# Patient Record
Sex: Female | Born: 1995 | Race: White | Hispanic: No | Marital: Single | Smoking: Current some day smoker
Health system: Southern US, Community
[De-identification: ages and names within clinical notes are randomized; demographics above are authoritative.]

## PROBLEM LIST (undated history)

## (undated) DIAGNOSIS — F419 Anxiety disorder, unspecified: Secondary | ICD-10-CM

## (undated) DIAGNOSIS — F32A Depression, unspecified: Secondary | ICD-10-CM

## (undated) DIAGNOSIS — F329 Major depressive disorder, single episode, unspecified: Secondary | ICD-10-CM

## (undated) DIAGNOSIS — I471 Supraventricular tachycardia, unspecified: Secondary | ICD-10-CM

---

## 2014-09-19 DIAGNOSIS — S060X0A Concussion without loss of consciousness, initial encounter: Secondary | ICD-10-CM | POA: Insufficient documentation

## 2014-09-19 DIAGNOSIS — Y9389 Activity, other specified: Secondary | ICD-10-CM | POA: Insufficient documentation

## 2014-09-19 DIAGNOSIS — S0990XA Unspecified injury of head, initial encounter: Secondary | ICD-10-CM | POA: Diagnosis present

## 2014-09-19 DIAGNOSIS — W228XXA Striking against or struck by other objects, initial encounter: Secondary | ICD-10-CM | POA: Diagnosis not present

## 2014-09-19 DIAGNOSIS — Z79899 Other long term (current) drug therapy: Secondary | ICD-10-CM | POA: Insufficient documentation

## 2014-09-19 DIAGNOSIS — Y998 Other external cause status: Secondary | ICD-10-CM | POA: Diagnosis not present

## 2014-09-19 DIAGNOSIS — Y9289 Other specified places as the place of occurrence of the external cause: Secondary | ICD-10-CM | POA: Diagnosis not present

## 2014-09-19 DIAGNOSIS — Z3202 Encounter for pregnancy test, result negative: Secondary | ICD-10-CM | POA: Diagnosis not present

## 2014-09-20 ENCOUNTER — Encounter (HOSPITAL_COMMUNITY): Payer: Self-pay | Admitting: Emergency Medicine

## 2014-09-20 ENCOUNTER — Emergency Department (HOSPITAL_COMMUNITY): Payer: BLUE CROSS/BLUE SHIELD

## 2014-09-20 ENCOUNTER — Emergency Department (HOSPITAL_COMMUNITY)
Admission: EM | Admit: 2014-09-20 | Discharge: 2014-09-20 | Disposition: A | Discharge status: Home / Self Care | Payer: BLUE CROSS/BLUE SHIELD | Attending: Emergency Medicine | Admitting: Emergency Medicine

## 2014-09-20 DIAGNOSIS — S0990XA Unspecified injury of head, initial encounter: Secondary | ICD-10-CM

## 2014-09-20 DIAGNOSIS — S060X0A Concussion without loss of consciousness, initial encounter: Secondary | ICD-10-CM

## 2014-09-20 LAB — URINALYSIS, ROUTINE W REFLEX MICROSCOPIC
Bilirubin Urine: NEGATIVE
GLUCOSE, UA: NEGATIVE mg/dL
Hgb urine dipstick: NEGATIVE
Ketones, ur: NEGATIVE mg/dL
Leukocytes, UA: NEGATIVE
Nitrite: NEGATIVE
PH: 6.5 (ref 5.0–8.0)
Protein, ur: NEGATIVE mg/dL
Specific Gravity, Urine: 1.019 (ref 1.005–1.030)
Urobilinogen, UA: 0.2 mg/dL (ref 0.0–1.0)

## 2014-09-20 LAB — COMPREHENSIVE METABOLIC PANEL
ALT: 12 U/L (ref 0–35)
AST: 16 U/L (ref 0–37)
Albumin: 3.6 g/dL (ref 3.5–5.2)
Alkaline Phosphatase: 47 U/L (ref 39–117)
Anion gap: 8 (ref 5–15)
BUN: 10 mg/dL (ref 6–23)
CO2: 23 mmol/L (ref 19–32)
Calcium: 8.7 mg/dL (ref 8.4–10.5)
Chloride: 106 mEq/L (ref 96–112)
Creatinine, Ser: 0.69 mg/dL (ref 0.50–1.10)
GFR calc Af Amer: >90 mL/min (ref >90)
GFR calc non Af Amer: >90 mL/min (ref >90)
Glucose, Bld: 92 mg/dL (ref 70–99)
Potassium: 3.6 mmol/L (ref 3.5–5.1)
Sodium: 137 mmol/L (ref 135–145)
Total Bilirubin: 0.4 mg/dL (ref 0.3–1.2)
Total Protein: 6.6 g/dL (ref 6.0–8.3)

## 2014-09-20 LAB — CBC WITH DIFFERENTIAL/PLATELET
Basophils Absolute: 0 10*3/uL (ref 0.0–0.1)
Basophils Relative: 1 % (ref 0–1)
EOS ABS: 0 10*3/uL (ref 0.0–0.7)
EOS PCT: 1 % (ref 0–5)
HCT: 34.5 % — ABNORMAL LOW (ref 36.0–46.0)
Hemoglobin: 11.5 g/dL — ABNORMAL LOW (ref 12.0–15.0)
Lymphocytes Relative: 35 % (ref 12–46)
Lymphs Abs: 2.6 10*3/uL (ref 0.7–4.0)
MCH: 29.8 pg (ref 26.0–34.0)
MCHC: 33.3 g/dL (ref 30.0–36.0)
MCV: 89.4 fL (ref 78.0–100.0)
MONO ABS: 0.5 10*3/uL (ref 0.1–1.0)
Monocytes Relative: 6 % (ref 3–12)
Neutro Abs: 4.4 10*3/uL (ref 1.7–7.7)
Neutrophils Relative %: 57 % (ref 43–77)
Platelets: 248 10*3/uL (ref 150–400)
RBC: 3.86 MIL/uL — ABNORMAL LOW (ref 3.87–5.11)
RDW: 12.7 % (ref 11.5–15.5)
WBC: 7.5 10*3/uL (ref 4.0–10.5)

## 2014-09-20 LAB — RAPID URINE DRUG SCREEN, HOSP PERFORMED
Amphetamines: NOT DETECTED
BARBITURATES: NOT DETECTED
BENZODIAZEPINES: NOT DETECTED
Cocaine: NOT DETECTED
OPIATES: NOT DETECTED
Tetrahydrocannabinol: NOT DETECTED

## 2014-09-20 LAB — PREGNANCY, URINE: PREG TEST UR: NEGATIVE

## 2014-09-20 LAB — ETHANOL

## 2014-09-20 NOTE — Discharge Instructions (Signed)
Concussion  A concussion, or closed-head injury, is a brain injury caused by a direct blow to the head or by a quick and sudden movement (jolt) of the head or neck. Concussions are usually not life-threatening. Even so, the effects of a concussion can be serious. If you have had a concussion before, you are more likely to experience concussion-like symptoms after a direct blow to the head.   CAUSES  · Direct blow to the head, such as from running into another player during a soccer game, being hit in a fight, or hitting your head on a hard surface.  · A jolt of the head or neck that causes the brain to move back and forth inside the skull, such as in a car crash.  SIGNS AND SYMPTOMS  The signs of a concussion can be hard to notice. Early on, they may be missed by you, family members, and health care providers. You may look fine but act or feel differently.  Symptoms are usually temporary, but they may last for days, weeks, or even longer. Some symptoms may appear right away while others may not show up for hours or days. Every head injury is different. Symptoms include:  · Mild to moderate headaches that will not go away.  · A feeling of pressure inside your head.  · Having more trouble than usual:  ¨ Learning or remembering things you have heard.  ¨ Answering questions.  ¨ Paying attention or concentrating.  ¨ Organizing daily tasks.  ¨ Making decisions and solving problems.  · Slowness in thinking, acting or reacting, speaking, or reading.  · Getting lost or being easily confused.  · Feeling tired all the time or lacking energy (fatigued).  · Feeling drowsy.  · Sleep disturbances.  ¨ Sleeping more than usual.  ¨ Sleeping less than usual.  ¨ Trouble falling asleep.  ¨ Trouble sleeping (insomnia).  · Loss of balance or feeling lightheaded or dizzy.  · Nausea or vomiting.  · Numbness or tingling.  · Increased sensitivity to:  ¨ Sounds.  ¨ Lights.  ¨ Distractions.  · Vision problems or eyes that tire  easily.  · Diminished sense of taste or smell.  · Ringing in the ears.  · Mood changes such as feeling sad or anxious.  · Becoming easily irritated or angry for little or no reason.  · Lack of motivation.  · Seeing or hearing things other people do not see or hear (hallucinations).  DIAGNOSIS  Your health care provider can usually diagnose a concussion based on a description of your injury and symptoms. He or she will ask whether you passed out (lost consciousness) and whether you are having trouble remembering events that happened right before and during your injury.  Your evaluation might include:  · A brain scan to look for signs of injury to the brain. Even if the test shows no injury, you may still have a concussion.  · Blood tests to be sure other problems are not present.  TREATMENT  · Concussions are usually treated in an emergency department, in urgent care, or at a clinic. You may need to stay in the hospital overnight for further treatment.  · Tell your health care provider if you are taking any medicines, including prescription medicines, over-the-counter medicines, and natural remedies. Some medicines, such as blood thinners (anticoagulants) and aspirin, may increase the chance of complications. Also tell your health care provider whether you have had alcohol or are taking illegal drugs. This information   may affect treatment.  · Your health care provider will send you home with important instructions to follow.  · How fast you will recover from a concussion depends on many factors. These factors include how severe your concussion is, what part of your brain was injured, your age, and how healthy you were before the concussion.  · Most people with mild injuries recover fully. Recovery can take time. In general, recovery is slower in older persons. Also, persons who have had a concussion in the past or have other medical problems may find that it takes longer to recover from their current injury.  HOME  CARE INSTRUCTIONS  General Instructions  · Carefully follow the directions your health care provider gave you.  · Only take over-the-counter or prescription medicines for pain, discomfort, or fever as directed by your health care provider.  · Take only those medicines that your health care provider has approved.  · Do not drink alcohol until your health care provider says you are well enough to do so. Alcohol and certain other drugs may slow your recovery and can put you at risk of further injury.  · If it is harder than usual to remember things, write them down.  · If you are easily distracted, try to do one thing at a time. For example, do not try to watch TV while fixing dinner.  · Talk with family members or close friends when making important decisions.  · Keep all follow-up appointments. Repeated evaluation of your symptoms is recommended for your recovery.  · Watch your symptoms and tell others to do the same. Complications sometimes occur after a concussion. Older adults with a brain injury may have a higher risk of serious complications, such as a blood clot on the brain.  · Tell your teachers, school nurse, school counselor, coach, athletic trainer, or work manager about your injury, symptoms, and restrictions. Tell them about what you can or cannot do. They should watch for:  ¨ Increased problems with attention or concentration.  ¨ Increased difficulty remembering or learning new information.  ¨ Increased time needed to complete tasks or assignments.  ¨ Increased irritability or decreased ability to cope with stress.  ¨ Increased symptoms.  · Rest. Rest helps the brain to heal. Make sure you:  ¨ Get plenty of sleep at night. Avoid staying up late at night.  ¨ Keep the same bedtime hours on weekends and weekdays.  ¨ Rest during the day. Take daytime naps or rest breaks when you feel tired.  · Limit activities that require a lot of thought or concentration. These include:  ¨ Doing homework or job-related  work.  ¨ Watching TV.  ¨ Working on the computer.  · Avoid any situation where there is potential for another head injury (football, hockey, soccer, basketball, martial arts, downhill snow sports and horseback riding). Your condition will get worse every time you experience a concussion. You should avoid these activities until you are evaluated by the appropriate follow-up health care providers.  Returning To Your Regular Activities  You will need to return to your normal activities slowly, not all at once. You must give your body and brain enough time for recovery.  · Do not return to sports or other athletic activities until your health care provider tells you it is safe to do so.  · Ask your health care provider when you can drive, ride a bicycle, or operate heavy machinery. Your ability to react may be slower after a   brain injury. Never do these activities if you are dizzy.  · Ask your health care provider about when you can return to work or school.  Preventing Another Concussion  It is very important to avoid another brain injury, especially before you have recovered. In rare cases, another injury can lead to permanent brain damage, brain swelling, or death. The risk of this is greatest during the first 7-10 days after a head injury. Avoid injuries by:  · Wearing a seat belt when riding in a car.  · Drinking alcohol only in moderation.  · Wearing a helmet when biking, skiing, skateboarding, skating, or doing similar activities.  · Avoiding activities that could lead to a second concussion, such as contact or recreational sports, until your health care provider says it is okay.  · Taking safety measures in your home.  ¨ Remove clutter and tripping hazards from floors and stairways.  ¨ Use grab bars in bathrooms and handrails by stairs.  ¨ Place non-slip mats on floors and in bathtubs.  ¨ Improve lighting in dim areas.  SEEK MEDICAL CARE IF:  · You have increased problems paying attention or  concentrating.  · You have increased difficulty remembering or learning new information.  · You need more time to complete tasks or assignments than before.  · You have increased irritability or decreased ability to cope with stress.  · You have more symptoms than before.  Seek medical care if you have any of the following symptoms for more than 2 weeks after your injury:  · Lasting (chronic) headaches.  · Dizziness or balance problems.  · Nausea.  · Vision problems.  · Increased sensitivity to noise or light.  · Depression or mood swings.  · Anxiety or irritability.  · Memory problems.  · Difficulty concentrating or paying attention.  · Sleep problems.  · Feeling tired all the time.  SEEK IMMEDIATE MEDICAL CARE IF:  · You have severe or worsening headaches. These may be a sign of a blood clot in the brain.  · You have weakness (even if only in one hand, leg, or part of the face).  · You have numbness.  · You have decreased coordination.  · You vomit repeatedly.  · You have increased sleepiness.  · One pupil is larger than the other.  · You have convulsions.  · You have slurred speech.  · You have increased confusion. This may be a sign of a blood clot in the brain.  · You have increased restlessness, agitation, or irritability.  · You are unable to recognize people or places.  · You have neck pain.  · It is difficult to wake you up.  · You have unusual behavior changes.  · You lose consciousness.  MAKE SURE YOU:  · Understand these instructions.  · Will watch your condition.  · Will get help right away if you are not doing well or get worse.  Document Released: 11/20/2003 Document Revised: 09/04/2013 Document Reviewed: 03/22/2013  [employer]® Patient Information ©2015 [employer]. This information is not intended to replace advice given to you by your health care provider. Make sure you discuss any questions you have with your health care provider.

## 2014-09-20 NOTE — ED Provider Notes (Signed)
CSN: [id number]     Arrival date & time 09/19/14  2355 History  This chart was scribed for Loren Raceravid Analeise Mccleery, MD by Annye AsaAnna Dorsett, ED Scribe. This patient was seen in room A01C/A01C and the patient's care was started at 1:00 AM.    Chief Complaint  Patient presents with  . Headache   HPI   HPI Comments: Colleen Shepherd is a 19 y.o. female who presents to the Emergency Department complaining of headache after accidentally hitting her forehead against a car door frame earlier this evening. Patient is unsure of whether or not she lost consciousness; she does not know the time of the incident. Patient's boyfriend recalls a call from her just after the incident around 23:00 tonight. He brought her to the ED after due to her confusion/abnormal behavior. No treatments or medications for headache tried PTA. Patient reports a history of "bad concussions," last instance 3 years PTA. She denies nausea, abdominal pain, neck pain, chest pain. No focal weakness or numbness. Negative for changes.  She denies EtOH or illcit drug use tonight.   History reviewed. No pertinent past medical history. History reviewed. No pertinent past surgical history. No family history on file. History  Substance Use Topics  . Smoking status: Never Smoker   . Smokeless tobacco: Not on file  . Alcohol Use: No   OB History    No data available     Review of Systems  Eyes: Negative for visual disturbance.  Cardiovascular: Negative for chest pain.  Gastrointestinal: Negative for nausea, vomiting and abdominal pain.  Musculoskeletal: Negative for neck pain and neck stiffness.  Skin: Negative for wound.  Neurological: Positive for headaches. Negative for dizziness, weakness, light-headedness and numbness.  All other systems reviewed and are negative.     Allergies  Review of patient's allergies indicates no known allergies.  Home Medications   Prior to Admission medications   Medication Sig Start Date End Date Taking?  Authorizing Provider  FLUoxetine (PROZAC) 10 MG capsule Take 10 mg by mouth daily.   Yes Historical Provider, MD  hydrOXYzine (ATARAX/VISTARIL) 10 MG tablet Take 10 mg by mouth 3 (three) times daily as needed for anxiety.   Yes Historical Provider, MD  ibuprofen (ADVIL,MOTRIN) 800 MG tablet Take 800 mg by mouth every 8 (eight) hours as needed for moderate pain.   Yes Historical Provider, MD  levonorgestrel-ethinyl estradiol (LUTERA) 0.1-20 MG-MCG tablet Take 1 tablet by mouth daily.   Yes Historical Provider, MD   BP 102/60 mmHg  Pulse 84  Temp(Src) 98.9 F (37.2 C) (Oral)  Resp 12  SpO2 99%  LMP 09/12/2014 (Exact Date) Physical Exam  Constitutional: She is oriented to person, place, and time. She appears well-developed and well-nourished. No distress.  Patient is emotionally labile, hesitant to speak.  HENT:  Head: Normocephalic.  Mouth/Throat: Oropharynx is clear and moist.  Mild contusion to the forehead. No underlying bony deformity. Midface is stable.  Eyes: EOM are normal. Pupils are equal, round, and reactive to light.  Neck: Normal range of motion. Neck supple.  No posterior midline cervical tenderness to palpation.  Cardiovascular: Normal rate and regular rhythm.  Exam reveals no gallop and no friction rub.   No murmur heard. Pulmonary/Chest: Effort normal and breath sounds normal. No respiratory distress. She has no wheezes. She has no rales. She exhibits no tenderness.  Abdominal: Soft. Bowel sounds are normal. She exhibits no distension and no mass. There is no tenderness. There is no rebound and no guarding.  Musculoskeletal:  Normal range of motion. She exhibits no edema or tenderness.  No thoracic or lumbar tenderness.  Neurological: She is alert and oriented to person, place, and time.  Patient is at times confused and emotionally labile. Unable to recall the time of the incident. 5/5 motor in all extremities. Sensation is fully intact. Finger to nose intact.  Skin:  Skin is warm and dry. No rash noted. No erythema.  Nursing note and vitals reviewed.   ED Course  Procedures   DIAGNOSTIC STUDIES: Oxygen Saturation is 99% on RA, normal by my interpretation.    COORDINATION OF CARE: 1:07 AM Discussed treatment plan with pt at bedside and pt agreed to plan.   Labs Review Labs Reviewed  CBC WITH DIFFERENTIAL - Abnormal; Notable for the following:    RBC 3.86 (*)    Hemoglobin 11.5 (*)    HCT 34.5 (*)    All other components within normal limits  COMPREHENSIVE METABOLIC PANEL  URINALYSIS, ROUTINE W REFLEX MICROSCOPIC  PREGNANCY, URINE  ETHANOL  URINE RAPID DRUG SCREEN (HOSP PERFORMED)    Imaging Review Ct Head Wo Contrast  09/20/2014   CLINICAL DATA:  Acute onset of headache. Hit forehead against car door frame. No loss of consciousness. Initial encounter.  EXAM: CT HEAD WITHOUT CONTRAST  TECHNIQUE: Contiguous axial images were obtained from the base of the skull through the vertex without intravenous contrast.  COMPARISON:  None.  FINDINGS: There is no evidence of acute infarction, mass lesion, or intra- or extra-axial hemorrhage on CT.  Mildly prominent calcification is noted at the pineal gland. The posterior fossa, including the cerebellum, brainstem and fourth ventricle, is within normal limits. The third and lateral ventricles, and basal ganglia are unremarkable in appearance. The cerebral hemispheres are symmetric in appearance, with normal gray-white differentiation. No mass effect or midline shift is seen.  There is no evidence of fracture; visualized osseous structures are unremarkable in appearance. The visualized portions of the orbits are within normal limits. The paranasal sinuses and mastoid air cells are well-aerated. No significant soft tissue abnormalities are seen.  IMPRESSION: No evidence of traumatic intracranial injury or fracture.   Electronically Signed   By: Roanna Raider M.D.   On: 09/20/2014 03:09     EKG  Interpretation None      MDM   Final diagnoses:  Head injury    I personally performed the services described in this documentation, which was scribed in my presence. The recorded information has been reviewed and considered.  Patient with confusion and bizarre behavior post closed head trauma. Previous history of concussion.  She was at her mental baseline. Neurologic exam is normal. CT without any acute findings. Head injury precautions given.   Loren Racer, MD 09/20/14 [phone]

## 2014-09-20 NOTE — ED Notes (Signed)
Pt. reports headache after accidentally hit her forehead against car door frame this evening , no LOC / ambulatory , alert and oriented.

## 2014-10-01 ENCOUNTER — Encounter (HOSPITAL_COMMUNITY): Payer: Self-pay | Admitting: Emergency Medicine

## 2014-10-01 ENCOUNTER — Emergency Department (HOSPITAL_COMMUNITY)
Admission: EM | Admit: 2014-10-01 | Discharge: 2014-10-02 | Disposition: A | Discharge status: Home / Self Care | Payer: BLUE CROSS/BLUE SHIELD | Attending: Emergency Medicine | Admitting: Emergency Medicine

## 2014-10-01 DIAGNOSIS — R197 Diarrhea, unspecified: Secondary | ICD-10-CM | POA: Diagnosis not present

## 2014-10-01 DIAGNOSIS — Z8679 Personal history of other diseases of the circulatory system: Secondary | ICD-10-CM | POA: Diagnosis not present

## 2014-10-01 DIAGNOSIS — R6883 Chills (without fever): Secondary | ICD-10-CM | POA: Insufficient documentation

## 2014-10-01 DIAGNOSIS — R112 Nausea with vomiting, unspecified: Secondary | ICD-10-CM

## 2014-10-01 DIAGNOSIS — Z3202 Encounter for pregnancy test, result negative: Secondary | ICD-10-CM | POA: Diagnosis not present

## 2014-10-01 DIAGNOSIS — F329 Major depressive disorder, single episode, unspecified: Secondary | ICD-10-CM | POA: Diagnosis not present

## 2014-10-01 DIAGNOSIS — F419 Anxiety disorder, unspecified: Secondary | ICD-10-CM | POA: Diagnosis not present

## 2014-10-01 DIAGNOSIS — Z720 Tobacco use: Secondary | ICD-10-CM | POA: Insufficient documentation

## 2014-10-01 DIAGNOSIS — R109 Unspecified abdominal pain: Secondary | ICD-10-CM | POA: Diagnosis present

## 2014-10-01 DIAGNOSIS — Z79899 Other long term (current) drug therapy: Secondary | ICD-10-CM | POA: Insufficient documentation

## 2014-10-01 HISTORY — DX: Anxiety disorder, unspecified: F41.9

## 2014-10-01 HISTORY — DX: Supraventricular tachycardia, unspecified: I47.10

## 2014-10-01 HISTORY — DX: Major depressive disorder, single episode, unspecified: F32.9

## 2014-10-01 HISTORY — DX: Depression, unspecified: F32.A

## 2014-10-01 HISTORY — DX: Supraventricular tachycardia: I47.1

## 2014-10-01 LAB — CBC WITH DIFFERENTIAL/PLATELET
BASOS PCT: 0 % (ref 0–1)
Basophils Absolute: 0 10*3/uL (ref 0.0–0.1)
EOS PCT: 0 % (ref 0–5)
Eosinophils Absolute: 0 10*3/uL (ref 0.0–0.7)
HCT: 39.5 % (ref 36.0–46.0)
HEMOGLOBIN: 13.4 g/dL (ref 12.0–15.0)
LYMPHS ABS: 0.4 10*3/uL — AB (ref 0.7–4.0)
Lymphocytes Relative: 3 % — ABNORMAL LOW (ref 12–46)
MCH: 30.9 pg (ref 26.0–34.0)
MCHC: 33.9 g/dL (ref 30.0–36.0)
MCV: 91.2 fL (ref 78.0–100.0)
Monocytes Absolute: 0.5 10*3/uL (ref 0.1–1.0)
Monocytes Relative: 4 % (ref 3–12)
NEUTROS PCT: 93 % — AB (ref 43–77)
Neutro Abs: 13.6 10*3/uL — ABNORMAL HIGH (ref 1.7–7.7)
Platelets: 264 10*3/uL (ref 150–400)
RBC: 4.33 MIL/uL (ref 3.87–5.11)
RDW: 12.8 % (ref 11.5–15.5)
WBC: 14.5 10*3/uL — AB (ref 4.0–10.5)

## 2014-10-01 LAB — COMPREHENSIVE METABOLIC PANEL WITH GFR
ALT: 22 U/L (ref 0–35)
AST: 26 U/L (ref 0–37)
Albumin: 4.2 g/dL (ref 3.5–5.2)
Alkaline Phosphatase: 47 U/L (ref 39–117)
Anion gap: 9 (ref 5–15)
BUN: 14 mg/dL (ref 6–23)
CO2: 22 mmol/L (ref 19–32)
Calcium: 8.8 mg/dL (ref 8.4–10.5)
Chloride: 108 meq/L (ref 96–112)
Creatinine, Ser: 0.73 mg/dL (ref 0.50–1.10)
GFR calc Af Amer: >90 mL/min
GFR calc non Af Amer: >90 mL/min
Glucose, Bld: 130 mg/dL — ABNORMAL HIGH (ref 70–99)
Potassium: 3.4 mmol/L — ABNORMAL LOW (ref 3.5–5.1)
Sodium: 139 mmol/L (ref 135–145)
Total Bilirubin: 0.9 mg/dL (ref 0.3–1.2)
Total Protein: 7.5 g/dL (ref 6.0–8.3)

## 2014-10-01 LAB — LIPASE, BLOOD: Lipase: 27 U/L (ref 11–59)

## 2014-10-01 MED ORDER — ONDANSETRON HCL 4 MG/2ML IJ SOLN
4.0000 mg | INTRAMUSCULAR | Status: AC
  Administered 2014-10-01: 4 mg via INTRAVENOUS
  Filled 2014-10-01: qty 2

## 2014-10-01 MED ORDER — DICYCLOMINE HCL 10 MG/ML IM SOLN: 20.0000 mg | Freq: Once | INTRAMUSCULAR | Status: DC

## 2014-10-01 MED ORDER — DICYCLOMINE HCL 10 MG PO CAPS
10.0000 mg | ORAL_CAPSULE | Freq: Once | ORAL | Status: AC
  Administered 2014-10-02: 10 mg via ORAL
  Filled 2014-10-01: qty 1

## 2014-10-01 MED ORDER — SODIUM CHLORIDE 0.9 % IV BOLUS (SEPSIS)
1000.0000 mL | Freq: Once | INTRAVENOUS | Status: AC
  Administered 2014-10-01: 1000 mL via INTRAVENOUS

## 2014-10-01 NOTE — ED Notes (Signed)
Pt is c/o abd pain with nausea, vomiting and diarrhea  Pt states she has been vomiting and having watery diarrhea for the past 4 hrs straight

## 2014-10-01 NOTE — ED Provider Notes (Signed)
CSN: [id number]     Arrival date & time 10/01/14  2211 History   First MD Initiated Contact with Patient 10/01/14 2231     Chief Complaint  Patient presents with  . Abdominal Pain  . Emesis  . Diarrhea   Colleen Shepherd is a 19 y.o. female with a history of SVT who presents to the ED complaining of nausea, vomiting, and diarrhea the past 4 hours. Patient also reports mid abdominal pain associated with this. Patient is also is complaining of some heartburn. Patient reports her abdominal pain started before her nausea, vomiting and diarrhea. She reports vomiting 10 times and 10 episodes of diarrhea. 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 Patient notes her last menstrual cycle was 2 weeks ago. Patient denies previous abdominal surgeries. The patient denies fevers, chills, chest pain, palpitations, rashes, dysuria, hematuria, urinary frequency, urinary urgency, hematemesis, or hematochezia.   (Consider location/radiation/quality/duration/timing/severity/associated sxs/prior Treatment) HPI  Past Medical History  Diagnosis Date  . SVT (supraventricular tachycardia)   . Depression   . Anxiety    History reviewed. No pertinent past surgical history. Family History  Problem Relation Age of Onset  . Cancer Other   . Depression Other    History  Substance Use Topics  . Smoking status: Current Some Day Smoker    Types: Cigarettes  . Smokeless tobacco: Not on file  . Alcohol Use: Yes     Comment: occ   OB History    No data available     Review of Systems  Constitutional: Positive for chills. Negative for fever.  HENT: Negative for congestion, sore throat and trouble swallowing.   Eyes: Negative for visual disturbance.  Respiratory: Negative for cough, shortness of breath and wheezing.   Cardiovascular: Negative for chest pain and palpitations.  Gastrointestinal: Positive for nausea, vomiting, abdominal pain and diarrhea. Negative for blood in stool.   Genitourinary: Negative for dysuria, frequency, hematuria, flank pain, vaginal bleeding, vaginal discharge and difficulty urinating.  Musculoskeletal: Negative for back pain and neck pain.  Skin: Negative for rash.  Neurological: Negative for weakness, light-headedness and headaches.      Allergies  Review of patient's allergies indicates no known allergies.  Home Medications   Prior to Admission medications   Medication Sig Start Date End Date Taking? Authorizing Provider  FLUoxetine (PROZAC) 10 MG capsule Take 10 mg by mouth at bedtime.    Yes Historical Provider, MD  ibuprofen (ADVIL,MOTRIN) 800 MG tablet Take 800 mg by mouth every 8 (eight) hours as needed for moderate pain.   Yes Historical Provider, MD  levonorgestrel-ethinyl estradiol (LUTERA) 0.1-20 MG-MCG tablet Take 1 tablet by mouth at bedtime.    Yes Historical Provider, MD  dicyclomine (BENTYL) 20 MG tablet Take 1 tablet (20 mg total) by mouth 2 (two) times daily. 10/02/14   Einar Gip Larry Knipp, PA-C  hydrOXYzine (ATARAX/VISTARIL) 10 MG tablet Take 10 mg by mouth 3 (three) times daily as needed for anxiety.    Historical Provider, MD  ondansetron (ZOFRAN ODT) 4 MG disintegrating tablet Take 1 tablet (4 mg total) by mouth every 8 (eight) hours as needed for nausea or vomiting. 10/02/14   Einar Gip Dade Rodin, PA-C   BP 110/54 mmHg  Pulse 84  Temp(Src) 97.8 F (36.6 C) (Oral)  Resp 18  SpO2 100%  LMP 08/25/2014 (Exact Date) Physical Exam  Constitutional: She appears well-developed and well-nourished. No distress.  HENT:  Head: Normocephalic and atraumatic.  Right Ear: External  ear normal.  Left Ear: External ear normal.  Nose: Nose normal.  Mouth/Throat: Oropharynx is clear and moist. No oropharyngeal exudate.  Eyes: Conjunctivae are normal. Pupils are equal, round, and reactive to light. Right eye exhibits no discharge. Left eye exhibits no discharge.  Neck: Neck supple.  Cardiovascular: Normal rate, regular  rhythm, normal heart sounds and intact distal pulses.  Exam reveals no gallop and no friction rub.   No murmur heard. HR 92  Pulmonary/Chest: Effort normal and breath sounds normal. No respiratory distress. She has no wheezes. She has no rales.  Abdominal: Soft. Bowel sounds are normal. She exhibits no distension and no mass. There is tenderness. There is no rebound and no guarding.  Abdomen is soft. Mid abdomen tenderness palpation. Bowel sounds are present. Negative Rovsing sign. No right lower quadrant tenderness palpation. Negative psoas and obturator sign.  Musculoskeletal: She exhibits no edema.  Lymphadenopathy:    She has no cervical adenopathy.  Neurological: She is alert. Coordination normal.  Skin: Skin is warm and dry. No rash noted. She is not diaphoretic. No erythema. No pallor.  Psychiatric: She has a normal mood and affect. Her behavior is normal.  Nursing note and vitals reviewed.   ED Course  Procedures (including critical care time) Labs Review Labs Reviewed  COMPREHENSIVE METABOLIC PANEL - Abnormal; Notable for the following:    Potassium 3.4 (*)    Glucose, Bld 130 (*)    All other components within normal limits  CBC WITH DIFFERENTIAL - Abnormal; Notable for the following:    WBC 14.5 (*)    Neutrophils Relative % 93 (*)    Neutro Abs 13.6 (*)    Lymphocytes Relative 3 (*)    Lymphs Abs 0.4 (*)    All other components within normal limits  URINALYSIS, ROUTINE W REFLEX MICROSCOPIC - Abnormal; Notable for the following:    Color, Urine AMBER (*)    Specific Gravity, Urine 1.036 (*)    Bilirubin Urine SMALL (*)    Ketones, ur >80 (*)    Protein, ur 30 (*)    All other components within normal limits  URINE MICROSCOPIC-ADD ON - Abnormal; Notable for the following:    Squamous Epithelial / LPF FEW (*)    All other components within normal limits  LIPASE, BLOOD  PREGNANCY, URINE    Imaging Review No results found.   EKG Interpretation None      Filed  Vitals:   10/01/14 2218 10/02/14 0005  BP: 112/63 110/54  Pulse: 125 84  Temp: 97.8 F (36.6 C)   TempSrc: Oral   Resp: 18   SpO2: 100% 100%     MDM   Meds given in ED:  Medications  ondansetron (ZOFRAN) injection 4 mg (4 mg Intravenous Given 10/01/14 2322)  sodium chloride 0.9 % bolus 1,000 mL (0 mLs Intravenous Stopped 10/02/14 0003)  dicyclomine (BENTYL) capsule 10 mg (10 mg Oral Given 10/02/14 0004)    New Prescriptions   DICYCLOMINE (BENTYL) 20 MG TABLET    Take 1 tablet (20 mg total) by mouth 2 (two) times daily.   ONDANSETRON (ZOFRAN ODT) 4 MG DISINTEGRATING TABLET    Take 1 tablet (4 mg total) by mouth every 8 (eight) hours as needed for nausea or vomiting.    Final diagnoses:  Nausea vomiting and diarrhea   This is a 19 year old female who presents the emergency department complaining of abdominal pain, nausea, vomiting and diarrhea for the past 4 hours. 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 Patient is afebrile and nontoxic-appearing. Patient is soft and no peritoneal signs. Patient reports resolution of her nausea and vomiting and diarrhea after fluid bolus, Zofran and Bentyl. Patient reports limited discharge. Patient has tolerated apple juice in the ED prior to discharge. Strict return precautions were provided. I advised the patient to follow-up with their primary care provider this week. I advised the patient to return to the emergency department with new or worsening symptoms or new concerns. The patient verbalized understanding and agreement with plan.   This patient was discussed with Dr. Nicanor AlconPalumbo who agrees with assessment and plan.    Lawana ChambersWilliam Duncan Ahni Bradwell, PA-C 10/02/14 0105  April K Palumbo-Rasch, MD 10/02/14 [phone]

## 2014-10-02 LAB — URINALYSIS, ROUTINE W REFLEX MICROSCOPIC
Glucose, UA: NEGATIVE mg/dL
HGB URINE DIPSTICK: NEGATIVE
Leukocytes, UA: NEGATIVE
Nitrite: NEGATIVE
PROTEIN: 30 mg/dL — AB
Specific Gravity, Urine: 1.036 — ABNORMAL HIGH (ref 1.005–1.030)
UROBILINOGEN UA: 0.2 mg/dL (ref 0.0–1.0)
pH: 5.5 (ref 5.0–8.0)

## 2014-10-02 LAB — PREGNANCY, URINE: PREG TEST UR: NEGATIVE

## 2014-10-02 LAB — URINE MICROSCOPIC-ADD ON

## 2014-10-02 MED ORDER — ONDANSETRON HCL 4 MG/2ML IJ SOLN
4.0000 mg | INTRAMUSCULAR | Status: AC
  Administered 2014-10-02: 4 mg via INTRAVENOUS
  Filled 2014-10-02: qty 2

## 2014-10-02 MED ORDER — DICYCLOMINE HCL 20 MG PO TABS: 20.0000 mg | ORAL_TABLET | Freq: Two times a day (BID) | ORAL | Status: AC

## 2014-10-02 MED ORDER — ONDANSETRON 4 MG PO TBDP: 4.0000 mg | ORAL_TABLET | Freq: Three times a day (TID) | ORAL | Status: AC | PRN

## 2014-10-02 NOTE — Discharge Instructions (Signed)

## 2014-10-02 NOTE — ED Notes (Signed)
Spoke with lab, states that UA will be completed at this time.

## 2014-10-02 NOTE — ED Notes (Signed)
Went to speak to pt about discharge papers, stated that she was nauseated again. Will speak to PA.

## 2015-11-08 IMAGING — CT CT HEAD W/O CM
1 series · 15 of 28 positions shown, 19 images · non-contrast
Comparison: None.

CLINICAL DATA: Acute onset of headache. Hit forehead against car
door frame. No loss of consciousness. Initial encounter.

EXAM:
CT HEAD WITHOUT CONTRAST
TECHNIQUE: Contiguous axial images were obtained from the base of the skull
through the vertex without intravenous contrast.

[Series 2: head 5.0 h30s · axial · 0.44mm/px · z∈[-110,+15]mm · 15 of 28 slices shown, 19 images]
[im 2/28  brain]
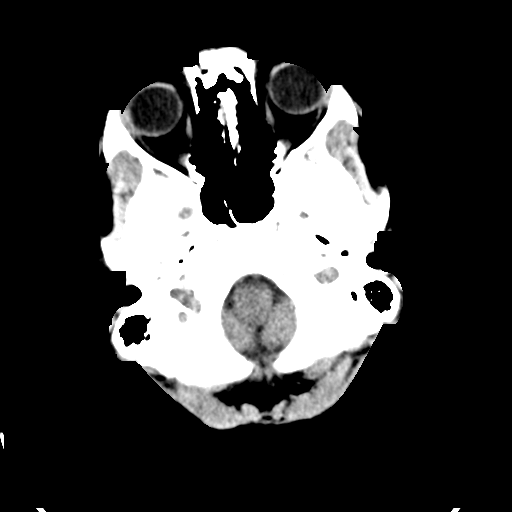
[im 2/28  bone]
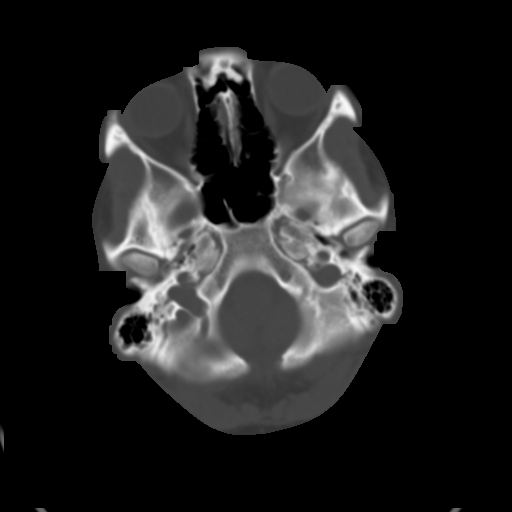
[im 4/28  brain]
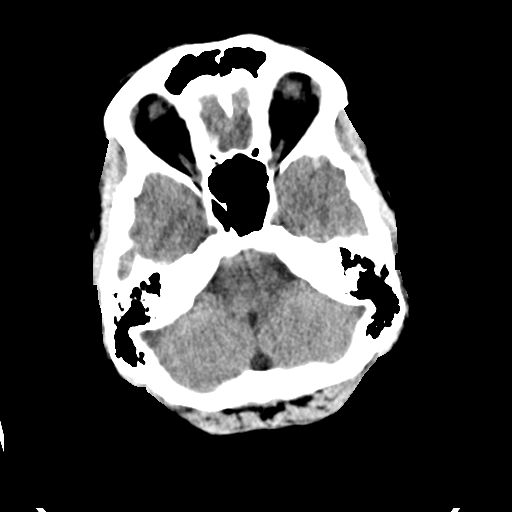
[im 6/28  brain]
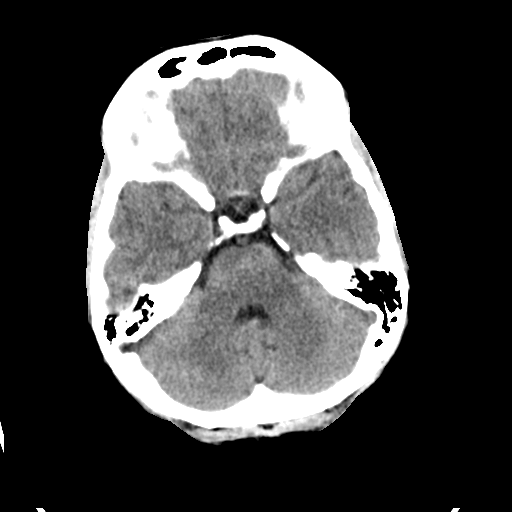
[im 8/28  brain]
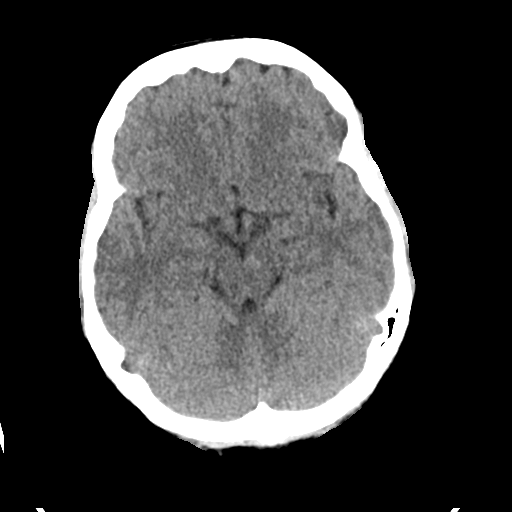
[im 9/28  brain]
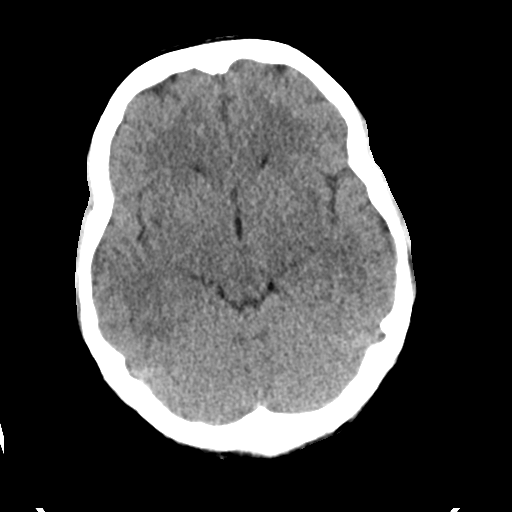
[im 9/28  bone]
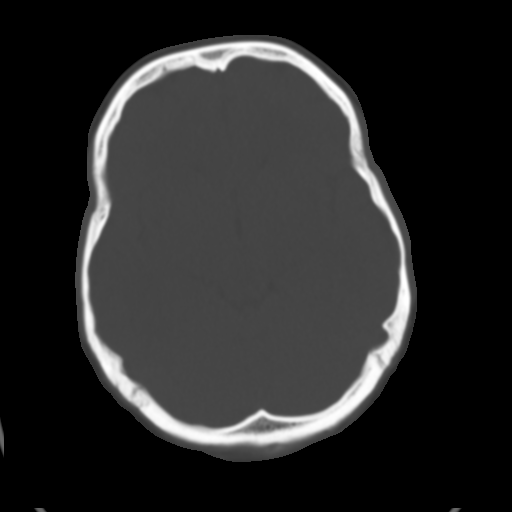
[im 11/28  brain]
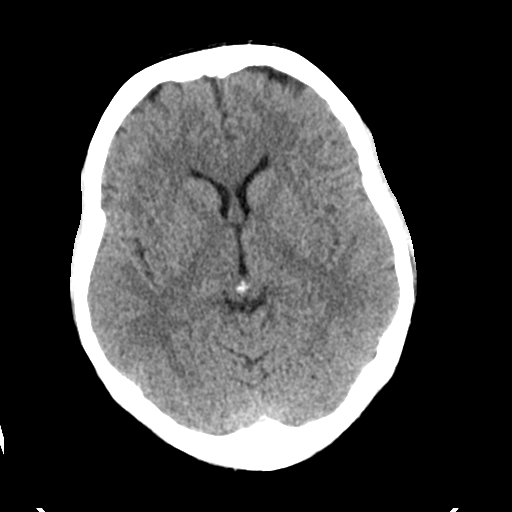
[im 13/28  brain]
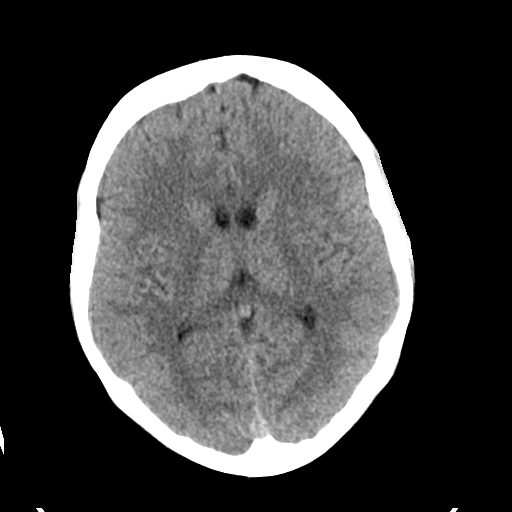
[im 15/28  brain]
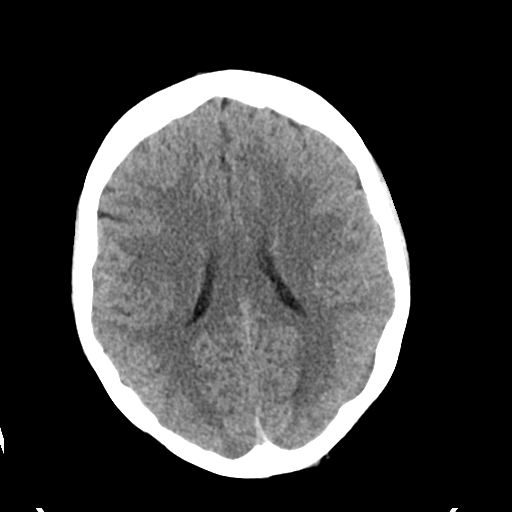
[im 16/28  brain]
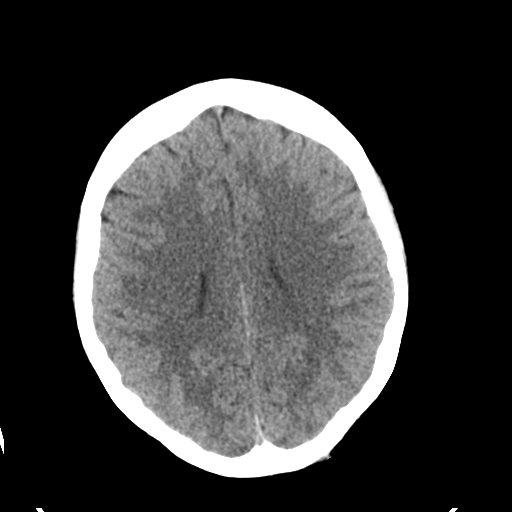
[im 16/28  bone]
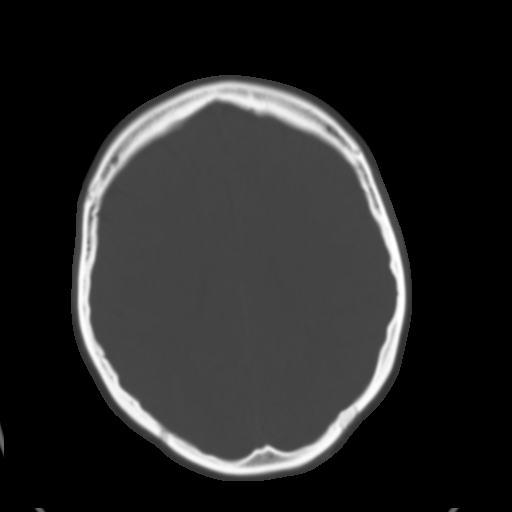
[im 18/28  brain]
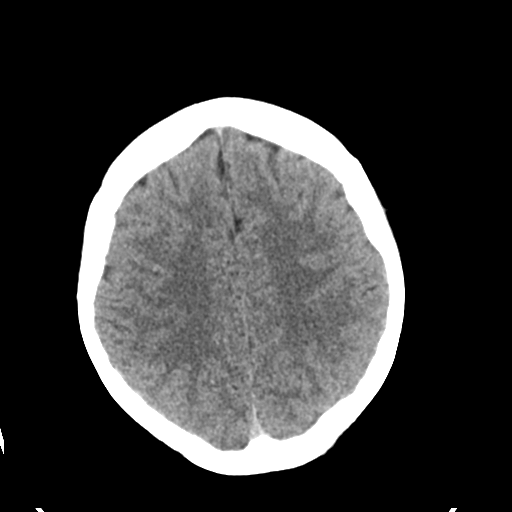
[im 20/28  brain]
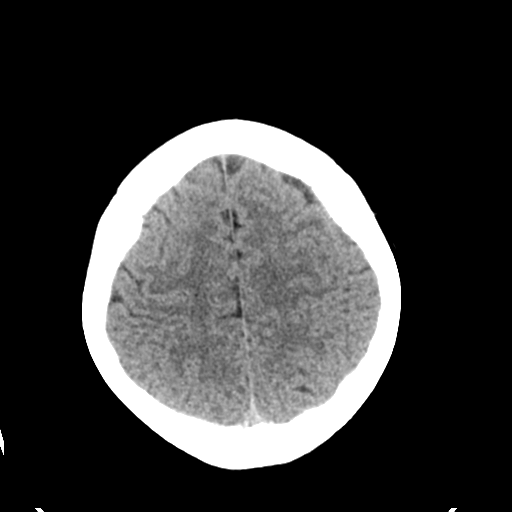
[im 21/28  brain]
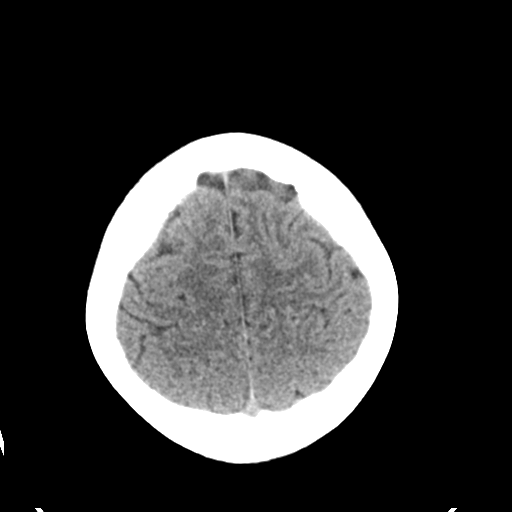
[im 23/28  brain]
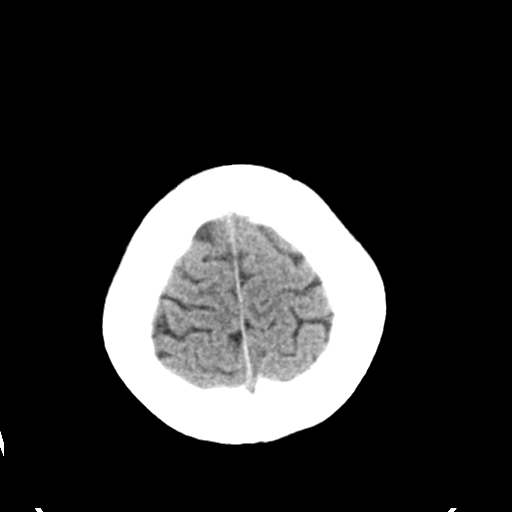
[im 23/28  bone]
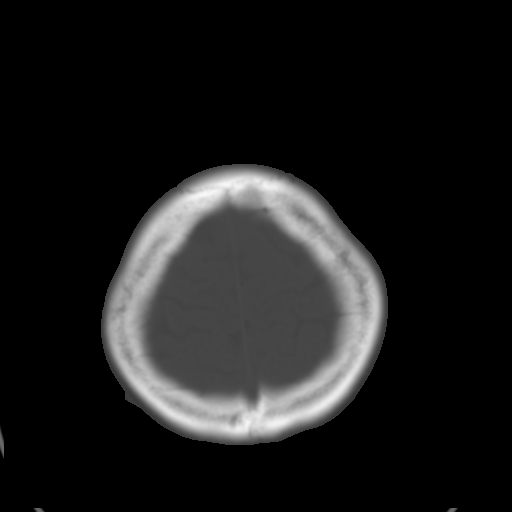
[im 25/28  brain]
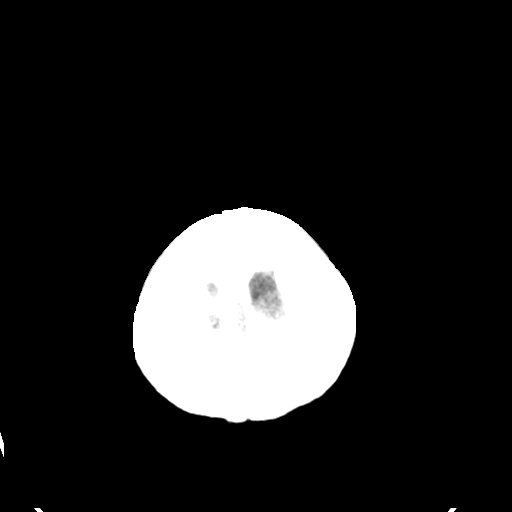
[im 27/28  brain]
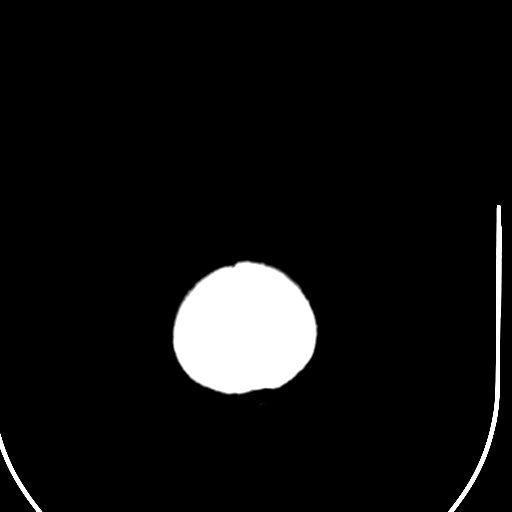

[15 of 28 positions shown; findings below may reference images not displayed]

FINDINGS: There is no evidence of acute infarction, mass lesion, or intra- or
extra-axial hemorrhage on CT.

Mildly prominent calcification is noted at the pineal gland. The
posterior fossa, including the cerebellum, brainstem and fourth
ventricle, is within normal limits. The third and lateral
ventricles, and basal ganglia are unremarkable in appearance. The
cerebral hemispheres are symmetric in appearance, with normal
gray-white differentiation. No mass effect or midline shift is seen.

There is no evidence of fracture; visualized osseous structures are
unremarkable in appearance. The visualized portions of the orbits
are within normal limits. The paranasal sinuses and mastoid air
cells are well-aerated. No significant soft tissue abnormalities are
seen.
IMPRESSION: No evidence of traumatic intracranial injury or fracture.
